# Patient Record
Sex: Male | Born: 2000 | Race: Black or African American | Hispanic: No | Marital: Single | Smoking: Never smoker
Health system: Southern US, Community
[De-identification: ages and names within clinical notes are randomized; demographics above are authoritative.]

---
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
## 2022-02-10 ENCOUNTER — Institutional Professional Consult (permissible substitution): Payer: Medicaid Other | Admitting: Licensed Clinical Social Worker

## 2022-02-10 NOTE — BH Specialist Note (Deleted)
Integrated Behavioral Health Initial In-Person Visit  MRN: [id number] Name: Kevin Sparks  Number of Integrated Behavioral Health Clinician visits: No data recorded Session Start time: No data recorded   Session End time: No data recorded Total time in minutes: No data recorded  Types of Service: {CHL AMB TYPE OF [phone]}  Interpretor:No. Interpretor Name and Language: n/a  Subjective: Kevin Sparks is a 21 y.o. male accompanied by {CHL AMB ACCOMPANIED AG:[id number]} Patient was referred by Dr. Maris Berger for anxiety. Patient reports the following symptoms/concerns: *** Duration of problem: ***; Severity of problem: {Mild/Moderate/Severe:20260}  Objective: Mood: {BHH MOOD:22306} and Affect: {BHH AFFECT:22307} Risk of harm to self or others: {CHL AMB BH Suicide Current Mental Status:21022748}  Life Context: Family and Social: *** School/Work: *** Self-Care: *** Life Changes: ***  Patient and/or Family's Strengths/Protective Factors: {CHL AMB BH PROTECTIVE FACTORS:[phone]}  Goals Addressed: Patient will: Reduce symptoms of: {IBH Symptoms:21014056} Increase knowledge and/or ability of: {IBH Patient Tools:21014057}  Demonstrate ability to: {IBH Goals:21014053}  Progress towards Goals: {CHL AMB BH PROGRESS TOWARDS GOALS:[phone]}  Interventions: Interventions utilized: {IBH Interventions:21014054}  Standardized Assessments completed: {IBH Screening Tools:21014051}  Patient and/or Family Response: ***  Patient Centered Plan: Patient is on the following Treatment Plan(s):  ***  Assessment: Patient currently experiencing ***.   Patient may benefit from ***.  Plan: Follow up with behavioral health clinician on : *** Behavioral recommendations: *** Referral(s): {IBH Referrals:21014055} "From scale of 1-10, how likely are you to follow plan?": ***  Carleene Overlie, [facility]

## 2022-09-15 ENCOUNTER — Ambulatory Visit (INDEPENDENT_AMBULATORY_CARE_PROVIDER_SITE_OTHER): Payer: Medicaid Other

## 2022-09-15 DIAGNOSIS — Z23 Encounter for immunization: Secondary | ICD-10-CM

## 2022-12-14 ENCOUNTER — Ambulatory Visit (INDEPENDENT_AMBULATORY_CARE_PROVIDER_SITE_OTHER): Payer: Medicaid Other | Admitting: Pediatrics

## 2022-12-14 ENCOUNTER — Other Ambulatory Visit: Payer: Self-pay

## 2022-12-14 VITALS — Temp 98.8°F | Wt 150.2 lb

## 2022-12-14 DIAGNOSIS — H1013 Acute atopic conjunctivitis, bilateral: Secondary | ICD-10-CM

## 2022-12-14 MED ORDER — OLOPATADINE HCL 0.2 % OP SOLN: 1.0000 [drp] | OPHTHALMIC | 3 refills | Status: AC | PRN

## 2022-12-14 MED ORDER — CETIRIZINE HCL 10 MG PO TABS: 10.0000 mg | ORAL_TABLET | Freq: Every day | ORAL | 12 refills | Status: AC

## 2022-12-14 NOTE — Patient Instructions (Addendum)
Use the cetirizine once a day and the olopatadine eyedrops as needed for allergy symptoms.  Remember to schedule his annual physical whenever convenient.  [facility] can continue to follow you for medical care for now. However, we do not see patients over the age of 21 years old. Here is a list of adult providers in the area to whom you may consider transitioning care.   Adult Primary Care Clinics Name [facility]  Address: [place], [place] [address]  Phone: [phone] Hours: Monday - Friday 9 AM -6 PM  Types of insurance accepted:  Commercial insurance [place] (orange card) [employer] Uninsured  Language services:  Video and phone interpreters available   Ages 27 and older    Adult primary care Onsite pharmacy Integrated behavioral health Financial assistance counseling Walk-in hours for established patients  Financial assistance counseling hours: Tuesdays 2:00PM - 5:00PM  Thursday 8:30AM - 4:30PM  Space is limited, 10 on Tuesday and 20 on Thursday. It's on first come first serve basis  Name [facility]  Address: [address] [place], [place] [address]  Phone: [phone]  Hours: Monday - Friday 8:30 AM - 5 PM  Types of insurance accepted:  Commercial insurance Medicaid Medicare Uninsured  Language services:  Video and phone interpreters available   All ages - newborn to adult   Primary care for all ages (children and adults) Integrated behavioral health Nutritionist Financial assistance counseling   Name [facility] on the ground floor of [facility]  Address: [address],  [place]  [address]  Phone: [phone]  Hours: Monday - Friday 8:15 AM - 5 PM  Types of insurance accepted:  Commercial  insurance Medicaid Medicare Uninsured  Language services:  Video and phone interpreters available   Ages 23 and older   Adult primary care Nutritionist Certified Diabetes Educator  Integrated behavioral health Financial assistance counseling   Name [facility]  Address: [address] [place], [place] [address]  Phone: [phone]  Hours: Monday - Friday 8:30 AM - 5 PM    Types of insurance accepted:  Pharmacist, community Medicaid Medicare Uninsured  Language services:  Video and phone interpreters available   All ages - newborn to adult   Primary care for all ages (children and adults) Integrated behavioral health Financial assistance counseling

## 2022-12-14 NOTE — Addendum Note (Signed)
Addended by: Gasper Sells on: 12/14/2022 03:00 PM   Modules accepted: Level of Service

## 2022-12-14 NOTE — Progress Notes (Addendum)
   Subjective:     Kevin Sparks, is a 22 y.o. male   History provider by patient and father No interpreter necessary.  Chief Complaint  Patient presents with   Nasal Congestion    Eyes itchy, watery, no fever    HPI:  Father reports he has had eye itchiness and watery discharge from his bilateral eyes for the past few days coinciding with the weather change. Symptoms are worse whenever he is outdoors. Reports that he has a history of allergies usually in the springtime.  Last year, he was prescribed olopatadine eyedrops, cetirizine, and Flonase for allergies.  He reports the olopatadine eyedrops or effective in managing symptoms.  He has run out of medications.  Denies congestion, rhinorrhea, cough, fever. Denies contact use.      Objective:     Temp 98.8 F (37.1 C) (Axillary)   Wt 150 lb 3.2 oz (68.1 kg)   BMI 22.35 kg/m   Physical Exam Constitutional:      General: He is not in acute distress. HENT:     Head:     Comments: Allergic shiners noted.  Dennie-Morgan lines noted.    Nose: No congestion or rhinorrhea.     Mouth/Throat:     Mouth: Mucous membranes are moist.     Pharynx: Oropharynx is clear.     Comments: No cobblestoning. Eyes:     Comments: Minimal conjunctival injection bilaterally.  No eye discharge.  Cardiovascular:     Rate and Rhythm: Normal rate and regular rhythm.  Pulmonary:     Effort: Pulmonary effort is normal. No respiratory distress.     Breath sounds: Normal breath sounds.  Neurological:     Mental Status: He is alert.        Assessment & Plan:   Allergic conjunctivitis of both eyes History and exam most consistent with seasonal allergic conjunctivitis related to pollen.  No rhinitis symptoms noted, will defer intranasal corticosteroids for now.  Treat with oral antihistamine and antihistamine eyedrops. - cetirizine (ZYRTEC) 10 MG tablet; Take 1 tablet (10 mg total) by mouth at bedtime.  Dispense: 30 tablet; Refill: 12 -  Olopatadine HCl (PATADAY) 0.2 % SOLN; Apply 1 drop to eye as needed.  Dispense: 2.5 mL; Refill: 3   Supportive care and return precautions reviewed.  Return if symptoms worsen or fail to improve, for Parent will call to schedule f/u appt .  Mother to call for well-child visit appointment  Zola Button, MD

## 2023-12-02 ENCOUNTER — Ambulatory Visit (HOSPITAL_COMMUNITY): Payer: MEDICAID | Admitting: Licensed Clinical Social Worker

## 2023-12-14 ENCOUNTER — Ambulatory Visit (HOSPITAL_COMMUNITY): Payer: MEDICAID | Admitting: Physician Assistant

## 2024-01-18 ENCOUNTER — Ambulatory Visit (INDEPENDENT_AMBULATORY_CARE_PROVIDER_SITE_OTHER): Payer: MEDICAID | Admitting: Pediatrics

## 2024-01-18 ENCOUNTER — Ambulatory Visit: Payer: MEDICAID | Admitting: Pediatrics

## 2024-01-18 ENCOUNTER — Ambulatory Visit: Payer: MEDICAID | Admitting: Student

## 2024-01-18 VITALS — Temp 97.7°F | Wt 201.4 lb

## 2024-01-18 DIAGNOSIS — R3589 Other polyuria: Secondary | ICD-10-CM | POA: Diagnosis not present

## 2024-01-18 LAB — POCT URINALYSIS DIPSTICK
Bilirubin, UA: NEGATIVE
Blood, UA: NEGATIVE
Glucose, UA: NEGATIVE
Ketones, UA: NEGATIVE
Leukocytes, UA: NEGATIVE
Nitrite, UA: NEGATIVE
Protein, UA: NEGATIVE
Spec Grav, UA: <=1.005 — AB (ref 1.010–1.025)
Urobilinogen, UA: 0.2 U/dL
pH, UA: 6.5 (ref 5.0–8.0)

## 2024-01-18 NOTE — Progress Notes (Signed)
   Subjective:     Kevin Sparks, is a 23 y.o. male   History provider by patient and family friend No interpreter necessary.  Chief Complaint  Patient presents with   Dysuria    Pain with urination x 1 week.  ? Blood in urine.      HPI: Patient accompanied by family friend.  Patient states that he began having painful urination last night.  Reports lower abdominal pain and burning with urination.  Reports that his urine was a pinkish color and he is concerned that it may have been blood.  Also reports occasional episodes of urinary incontinence that just began recently.  Family friend states that patient gets up frequently during the night to use the restroom and drinks a lot of water.  Patient states that he does drink a lot because he is frequently thirsty.  Family friend notes concern for weight gain.  Patient denies sexual activity. No fevers, penile pain, penile lesions, scrotal pain, back pain, penile discharge.  Review of Systems  Constitutional:  Negative for chills and fever.  Gastrointestinal:  Positive for abdominal pain.  Endocrine: Positive for polydipsia and polyuria.  Genitourinary:  Positive for dysuria and hematuria. Negative for flank pain, penile pain and testicular pain.     Patient's history was reviewed and updated as appropriate: allergies, current medications, past family history, past medical history, past social history, past surgical history, and problem list.     Objective:     Temp 97.7 F (36.5 C) (Temporal)   Wt 201 lb 6.4 oz (91.4 kg)   BMI 29.97 kg/m   Physical Exam Vitals reviewed.  Constitutional:      Appearance: Normal appearance. He is not ill-appearing.  Cardiovascular:     Rate and Rhythm: Normal rate and regular rhythm.  Pulmonary:     Effort: Pulmonary effort is normal.     Breath sounds: Normal breath sounds.  Abdominal:     General: Bowel sounds are normal. There is no distension.     Palpations: Abdomen is soft. There  is no mass.     Tenderness: There is no abdominal tenderness. There is no right CVA tenderness or left CVA tenderness.  Skin:    Capillary Refill: Capillary refill takes less than 2 seconds.     Findings: No rash.  Neurological:     Mental Status: He is alert.       Assessment & Plan:   Assessment & Plan Polyuria Pain with urination, frequency, polydipsia.  Urine dipstick today negative for infection or blood, notably urine sample was very clear.  Differentials include diabetes insipidus versus psychogenic polydipsia versus diabetes mellitus. - BMP, A1c, urine and serum osmolality, urine protein creatinine ratio, urine sodium collected - Urine sent for culture and microscopy  Patient and family friend advised to make appointment for well-child visit on the way out as he has not had a general physical exam in several years. Appt scheduled with PCP on 03/28/24.   Supportive care and return precautions reviewed.   Wayne Brunker, DO

## 2024-01-18 NOTE — Patient Instructions (Signed)
 Good to see you today - Thank you for coming in  Things we discussed today: Please make an appointment to have a general physical exam. Since you are 22 now, you can start looking for adult offices as well.  I will call if your testing is abnormal. You do not have a urinary tract infection, but we will send your urine for culture in case any bacteria grows.   Please come back if your symptoms worsen or do not improve

## 2024-01-20 LAB — URINE CULTURE
MICRO NUMBER:: 16419687
Result:: NO GROWTH
SPECIMEN QUALITY:: ADEQUATE

## 2024-01-20 LAB — BASIC METABOLIC PANEL WITH GFR
BUN: 10 mg/dL (ref 7–25)
CO2: 21 mmol/L (ref 20–32)
Calcium: 9.6 mg/dL (ref 8.6–10.3)
Chloride: 106 mmol/L (ref 98–110)
Creat: 0.89 mg/dL (ref 0.60–1.24)
Glucose, Bld: 83 mg/dL (ref 65–99)
Potassium: 4.5 mmol/L (ref 3.5–5.3)
Sodium: 137 mmol/L (ref 135–146)
eGFR: 126 mL/min/{1.73_m2} (ref >=60)

## 2024-01-20 LAB — HEMOGLOBIN A1C
Hgb A1c MFr Bld: 5.2 % (ref <5.7)
Mean Plasma Glucose: 103 mg/dL
eAG (mmol/L): 5.7 mmol/L

## 2024-01-20 LAB — PROTEIN / CREATININE RATIO, URINE
Creatinine, Urine: 17 mg/dL — ABNORMAL LOW (ref 20–320)
Protein/Creat Ratio: 118 mg/g{creat} (ref 25–148)
Protein/Creatinine Ratio: 0.118 mg/mg{creat} (ref 0.025–0.148)
Total Protein, Urine: 2 mg/dL — ABNORMAL LOW (ref 5–25)

## 2024-01-20 LAB — OSMOLALITY, URINE: Osmolality, Ur: 130 mosm/kg (ref 50–1200)

## 2024-01-20 LAB — URINALYSIS, MICROSCOPIC ONLY
Bacteria, UA: NONE SEEN /HPF
Hyaline Cast: NONE SEEN /LPF
RBC / HPF: NONE SEEN /HPF (ref 0–2)
Squamous Epithelial / HPF: NONE SEEN /HPF (ref <=5)
WBC, UA: NONE SEEN /HPF (ref 0–5)

## 2024-01-20 LAB — SODIUM, URINE, RANDOM: Sodium, Ur: 36 mmol/L (ref 28–272)

## 2024-01-20 LAB — OSMOLALITY: Osmolality: 290 mosm/kg (ref 278–305)

## 2024-01-21 ENCOUNTER — Telehealth: Payer: Self-pay | Admitting: *Deleted

## 2024-01-21 ENCOUNTER — Telehealth: Payer: Self-pay | Admitting: Pediatrics

## 2024-01-21 NOTE — Telephone Encounter (Signed)
 Good morning,   Patients mom called regarding labs that were done earlier this week. She states she has not yet received a call about the results. Her son is still having some discomfort with urination and she is wondering if another appointment needs to be made.  Please contact her at your earliest convenience.  Phone: [phone] Thanks!

## 2024-01-21 NOTE — Telephone Encounter (Signed)
 Spoke to Kevin Sparks's mother about labs from last office visit. And all is normal, A1C is normal,glucose normal u/a and urine culture normal. Mom says he drinks a lot of water all day. Mother agrees to bring Kevin Sparks back if not improving.

## 2024-01-26 ENCOUNTER — Telehealth: Payer: Self-pay | Admitting: Pediatrics

## 2024-01-26 DIAGNOSIS — R3589 Other polyuria: Secondary | ICD-10-CM

## 2024-01-26 NOTE — Telephone Encounter (Signed)
 Spoke to patient's mother to get a little bit more history about what has been going on, as well as to discuss the results and next steps.    Patient has increased his water intake over the last few months, at first seemed to be in the context of trying to decrease intake of soda.  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  Mother denies that he has had any nocturnal enuresis and reports that this has improved recently with some behavior modifications (not drinking for awhile before getting on the bus, reminder to use the bathroom just before leaving).    Discussed with mother that Laurin's lab results show normal electrolytes and kidney function, and that urine results are normal other than the urine was very dilute.  Discussed that this can be just from drinking a lot of water (primary polydipsia), or it can be from a rare medical condition where the body cannot concentrate the urine (vasopressin deficiency or resistance).  Plan: Patient will come in to lab to measure baseline serum copeptin level and pick up urine specimen cup. Will then do overnight fluid deprivation test, and collect first morning urine at home.  Before drinking any fluids, return to the lab to drop off the urine and have serum sodium, serum osmolality, and a repeat copeptin level drawn.    Please have the lab or nurse call Dr. Donley Furth at [phone] with any questions.    Patient's mother asked that I email these instructions to her which I have done.

## 2024-01-27 ENCOUNTER — Other Ambulatory Visit: Payer: MEDICAID

## 2024-01-27 DIAGNOSIS — R3589 Other polyuria: Secondary | ICD-10-CM

## 2024-02-01 LAB — OSMOLALITY, URINE: Osmolality, Ur: 757 mosm/kg (ref 50–1200)

## 2024-02-02 LAB — COPEPTIN: Copeptin: 3.9 pmol/L (ref <=13.7)

## 2024-02-03 ENCOUNTER — Telehealth: Payer: Self-pay | Admitting: Pediatrics

## 2024-02-03 NOTE — Telephone Encounter (Signed)
 Called patient's mother to discuss lab results.  Unfortunately, due to a misunderstanding, Juwuan did not come for the second lab draw after doing the overnight fluid deprivation, but did collect his urine.  His urine has a much higher osmolality than the baseline, indicating that his body can produce concentrated urine when it is appropriate to do so.  Discussed this with his mother that this was reassuring, though cannot 100% rule out vasopressin deficiency without the second copeptin level.  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  He has a wellness visit scheduled for July but encouraged to follow-up sooner if concern returns or new concerns arise.

## 2024-03-28 ENCOUNTER — Encounter: Payer: Self-pay | Admitting: Pediatrics

## 2024-03-28 ENCOUNTER — Ambulatory Visit (INDEPENDENT_AMBULATORY_CARE_PROVIDER_SITE_OTHER): Payer: MEDICAID | Admitting: Pediatrics

## 2024-03-28 VITALS — BP 110/72 | Ht 69.49 in | Wt 176.4 lb

## 2024-03-28 DIAGNOSIS — Z0001 Encounter for general adult medical examination with abnormal findings: Secondary | ICD-10-CM | POA: Diagnosis not present

## 2024-03-28 DIAGNOSIS — Z113 Encounter for screening for infections with a predominantly sexual mode of transmission: Secondary | ICD-10-CM | POA: Diagnosis not present

## 2024-03-28 DIAGNOSIS — Z1331 Encounter for screening for depression: Secondary | ICD-10-CM | POA: Diagnosis not present

## 2024-03-28 DIAGNOSIS — Z114 Encounter for screening for human immunodeficiency virus [HIV]: Secondary | ICD-10-CM | POA: Diagnosis not present

## 2024-03-28 DIAGNOSIS — F419 Anxiety disorder, unspecified: Secondary | ICD-10-CM

## 2024-03-28 DIAGNOSIS — R634 Abnormal weight loss: Secondary | ICD-10-CM | POA: Insufficient documentation

## 2024-03-28 DIAGNOSIS — F840 Autistic disorder: Secondary | ICD-10-CM

## 2024-03-28 DIAGNOSIS — E669 Obesity, unspecified: Secondary | ICD-10-CM | POA: Diagnosis not present

## 2024-03-28 DIAGNOSIS — Z1339 Encounter for screening examination for other mental health and behavioral disorders: Secondary | ICD-10-CM | POA: Diagnosis not present

## 2024-03-28 DIAGNOSIS — Z68.41 Body mass index (BMI) pediatric, greater than or equal to 95th percentile for age: Secondary | ICD-10-CM

## 2024-03-28 LAB — POCT RAPID HIV: Rapid HIV, POC: NEGATIVE

## 2024-03-28 NOTE — Progress Notes (Signed)
 Adolescent Well Care Visit Kevin Sparks is a 23 y.o. male who is here for well care.    PCP:  Gabriella Arthor GAILS, MD   History was provided by the patient and Mom's friend.  Confidentiality was discussed with the patient and, if applicable, with caregiver as well.    Current Issues: Current concerns include: Decreased appetite & increased water intake. Lost 25 lbs in the past 3 months as he is drinking mostly water & doesn't feel hungry. Not skipping meals but eating small portions. Also only likes fast foods Labwork was normal- normal CMP & HgB A1C. Family friend feels that his anxiety is worse as he has graduated from school & project work [employer] & presently home with no structure or social interactions. They are waiting for his placement with [employer] for a job. They are interested in starting him on therapy. Known H/o Autism spectrum disorder.  Nutrition: Nutrition/Eating Behaviors: picky eater- lot of water & limited food groups- likes chips & chicken nuggets. Eats rice & oatmeal but not other food groups. Adequate calcium in diet?: occasional cheese Supplements/ Vitamins: no  Exercise/ Media: Play any Sports?/ Exercise: no Screen Time:  > 2 hours-counseling provided Media Rules or Monitoring?: yes  Sleep:  Sleep: wakes up often to urinate as he drinks a lot of water. No enuresis  Social Screening: Lives with:  mom & mom's friend.  Parental relations:  good Activities, Work, and Regulatory affairs officer?: some cleaning chores Concerns regarding behavior with peers?  no Stressors of note: no  Education: School Name: graduated school & waiting for placement with [employer]    Confidential Social History: Tobacco?  no Secondhand smoke exposure?  no Drugs/ETOH?  no  Sexually Active?  no   Pregnancy Prevention: Abstinence  Safe at home, in school & in relationships?  Yes Safe to self?  Yes   Screenings: Patient has a dental home: yes  The patient completed the  Rapid Assessment for Adolescent Preventive Services screening questionnaire and the following topics were identified as risk factors and discussed: healthy eating, mental health issues, and social isolation    PHQ-9 completed and results indicated negative screen per pt  Physical Exam:  Vitals:   03/28/24 1042  BP: 110/72  Weight: 176 lb 6.4 oz (80 kg)  Height: 5' 9.49 (1.765 m)   BP 110/72   Ht 5' 9.49 (1.765 m)   Wt 176 lb 6.4 oz (80 kg)   BMI 25.69 kg/m  Body mass index: body mass index is 25.69 kg/m. Growth %ile SmartLinks can only be used for patients less than 47 years old.  Hearing Screening   500Hz  1000Hz  2000Hz  4000Hz   Right ear 20 20 20 20   Left ear 20 20 20 20    Vision Screening   Right eye Left eye Both eyes  Without correction 20/40 20/40 20/40   With correction       General Appearance:   alert, oriented, no acute distress  HENT: Normocephalic, no obvious abnormality, conjunctiva clear  Mouth:   Normal appearing teeth, no obvious discoloration, dental caries, or dental caps  Neck:   Supple; thyroid: no enlargement, symmetric, no tenderness/mass/nodules  Chest normal  Lungs:   Clear to auscultation bilaterally, normal work of breathing  Heart:   Regular rate and rhythm, S1 and S2 normal, no murmurs;   Abdomen:   Soft, non-tender, no mass, or organomegaly  GU normal male genitals, no testicular masses or hernia  Musculoskeletal:   Tone and strength strong  and symmetrical, all extremities               Lymphatic:   No cervical adenopathy  Skin/Hair/Nails:   Skin warm, dry and intact, no rashes, no bruises or petechiae  Neurologic:   Strength, gait, and coordination normal and age-appropriate     Assessment and Plan:   23 yr old M with autism for routine exam Recent weight loss due to decreased appetite & polydipsia Likely related to anxiety & behavioral Referral placed to mental health agency for evaluation & counseling  BMI is appropriate for  age  Hearing screening result:normal Vision screening result: abnormal. Needs new glasses Orders Placed This Encounter  Procedures   Ambulatory referral to Behavioral Health   POCT Rapid HIV     Return in 3 months (on 06/28/2024) for Recheck with Dr Gabriella.. Recheck weight  Arthor LULLA Gabriella, MD

## 2024-03-28 NOTE — Patient Instructions (Signed)
Goals: Choose more whole grains, lean protein, low-fat dairy, and fruits/non-starchy vegetables. Aim for 60 min of moderate physical activity daily. Limit sugar-sweetened beverages and concentrated sweets. Limit screen time to less than 2 hours daily.  [address]10 5 servings of fruits/vegetables a day 3 meals a day, no meal skipping 2 hours of screen time or less 1 hour of vigorous physical activity Almost no sugar-sweetened beverages or foods    

## 2024-04-20 ENCOUNTER — Telehealth: Payer: Self-pay

## 2024-04-20 NOTE — Telephone Encounter (Signed)
 This BH coordinator attempted to call the patient to follow up with Kristin Meredith at my therapy place to schedule an appointment for counseling.  The call was unsuccessful as I wasn't able to leave a voice message.

## 2024-06-28 ENCOUNTER — Ambulatory Visit: Payer: MEDICAID | Admitting: Pediatrics

## 2024-06-28 VITALS — BP 110/68 | HR 108 | Ht 68.98 in | Wt 168.8 lb

## 2024-06-28 DIAGNOSIS — F4320 Adjustment disorder, unspecified: Secondary | ICD-10-CM | POA: Diagnosis not present

## 2024-06-28 DIAGNOSIS — R634 Abnormal weight loss: Secondary | ICD-10-CM

## 2024-06-28 DIAGNOSIS — F840 Autistic disorder: Secondary | ICD-10-CM | POA: Diagnosis not present

## 2024-06-28 DIAGNOSIS — Z23 Encounter for immunization: Secondary | ICD-10-CM

## 2024-06-28 NOTE — Progress Notes (Signed)
 Subjective:    Kevin Sparks is a 23 y.o. male accompanied by family friend Starleen Pal presenting to the clinic today for follow up. At his well visit 3 months back it was noted that he had increased anxiety & was not drinking much water or eating foods. He had sig weight loss. Today the family fruiend who cares for him during the day reports that he has been drinking water but no sodas, juices or other beverages. He is a picky eater but has been eating his meals- eats 3 meals though he may not eat a lot at dinner. He however only likes fast food. He does not eat vegetables but eats fruits. He has continued with weight loss & lost 8 lbs in 3 months. No intercurrent illness. No h/o fevers, fatigue or tiredness. He had been referred for therapy for his anxiety but they have not been able to get in touch with mom. Family friend noted that they could call him to set up the appt. Kennard has no issues with sleep. He however is fidgety during the daytime as he does not have a routine. They are looking for a day program for him. Contact info of family friend: Starleen Pal [phone]  Review of Systems  Constitutional:  Negative for activity change, appetite change and fever.  HENT:  Negative for congestion.   Respiratory:  Negative for cough.   Gastrointestinal:  Negative for abdominal pain and vomiting.  Skin:  Negative for rash.       Objective:   Physical Exam Vitals and nursing note reviewed.  Constitutional:      General: He is not in acute distress. HENT:     Head: Normocephalic and atraumatic.     Right Ear: External ear normal.     Left Ear: External ear normal.     Nose: Nose normal.  Eyes:     General:        Right eye: No discharge.        Left eye: No discharge.     Conjunctiva/sclera: Conjunctivae normal.  Cardiovascular:     Rate and Rhythm: Normal rate and regular rhythm.     Heart sounds: Normal heart sounds.  Pulmonary:     Effort: No respiratory  distress.     Breath sounds: No wheezing or rales.  Musculoskeletal:     Cervical back: Normal range of motion.  Skin:    General: Skin is warm and dry.     Findings: No rash.    .BP 110/68 (BP Location: Right Arm, Patient Position: Sitting, Cuff Size: Normal)   Pulse (!) 108   Ht 5' 8.98 (1.752 m)   Wt 168 lb 12.8 oz (76.6 kg)   BMI 24.94 kg/m         Assessment & Plan:  1. Autism spectrum disorder (Primary) 2. Adjustment disorder, unspecified type Will re-[facility]  3. Weight loss Discussed healthy diet & maintaining a routine. Offer healthy high protein snacks if refusing a meal. Include dairy products for Vit d & Ca  4. Need for vaccination Discussed need for flu vaccine & side effects. - Flu vaccine trivalent PF, 6mos and older(Flulaval,Afluria,Fluarix,Fluzone)   Discussed transition to adult care as Lindbergh is turning 23 yrs this month. List provided.   Time spent reviewing chart in preparation for visit:  5 minutes Time spent face-to-face with patient: 20 minutes Time spent not face-to-face with patient for documentation and care coordination on date of service: 5  minutes  Return if symptoms worsen or fail to improve.  Arthor Harris, MD 06/28/2024 11:49 AM

## 2024-06-28 NOTE — Patient Instructions (Addendum)
 Goals: Choose more whole grains, lean protein, low-fat dairy, and fruits/non-starchy vegetables. Aim for 60 min of moderate physical activity daily. Limit sugar-sweetened beverages and concentrated sweets. Limit screen time to less than 2 hours daily.  [address] 5 servings of fruits/vegetables a day 3 meals a day, no meal skipping 2 hours of screen time or less 1 hour of vigorous physical activity Almost no sugar-sweetened beverages or foods      Adult Primary Care Clinics Name Criteria Services  [facility]   [facility]  Uninsured  [place] [facility] medical home for adults needing healthcare when it's not an emergency. Chronic disease management Disease prevention, diagnosis and treatment Onsite point-of-care laboratory testing. Health education and prevention programs. Physicals and immunizations  [address] [place], [place] [address]  Phone: [phone] Hours: Mon-Fri 9am-6pm Walk-in: Tues 2pm-5pm                 Thurs 8:30am-4:30pm Languages:  Language line available  Serves Adult patients  WALK IN HOURS FOR CLINIC [facility] DISCOUNT): TUESDAYS 2:00PM - 5:00PM and THURSDAYS 8:30AM - 4:30PM  Space is limited, 10 on Tuesday and 20 on Thursday. It's on first come first serve basis  Name Criteria Services  [facility]   [facility]  [address] [place], [place], [place] [address]  Phone: [phone]  Languages:  Access to language line
